# Patient Record
Sex: Male | Born: 1960 | Race: White | Hispanic: No | Marital: Married | Smoking: Former smoker
Health system: Southern US, Community
[De-identification: ages and names within clinical notes are randomized; demographics above are authoritative.]

## PROBLEM LIST (undated history)

## (undated) DIAGNOSIS — Z973 Presence of spectacles and contact lenses: Secondary | ICD-10-CM

## (undated) HISTORY — PX: FOOT SURGERY: SHX648

---
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
## 2005-09-07 ENCOUNTER — Ambulatory Visit (HOSPITAL_COMMUNITY): Admission: RE | Admit: 2005-09-07 | Discharge: 2005-09-08 | Payer: Self-pay | Admitting: Neurological Surgery

## 2006-09-10 HISTORY — PX: NECK SURGERY: SHX720

## 2007-08-08 IMAGING — RF DG CERVICAL SPINE 2 OR 3 VIEWS
1 series · 2 of 2 positions shown · non-contrast
Comparison: None.

CLINICAL DATA: ACDF C5-C7

INTRAOPERATIVE CERVICAL SPINE 1 VIEW 09/07/2005:

[Series 0: selected · 2 of 2 slices shown]
[im 1/2]
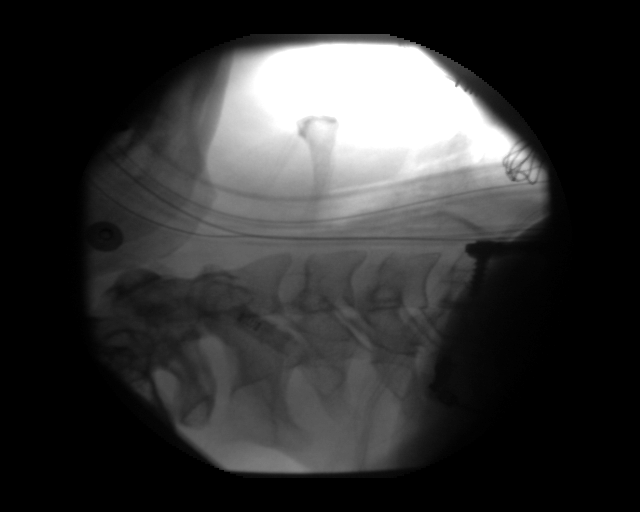
[im 2/2]
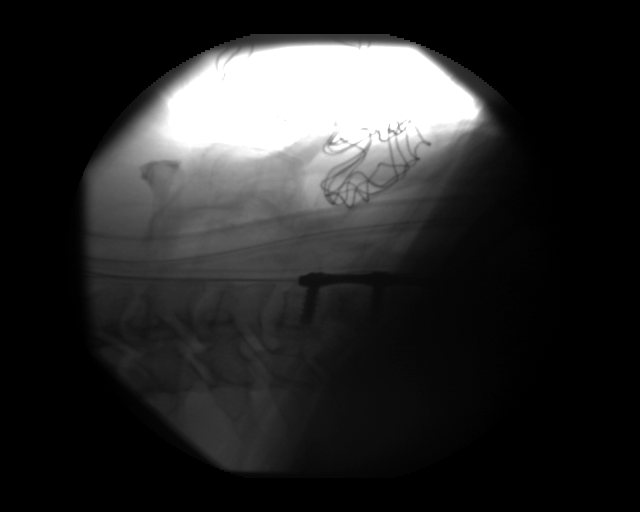

[2 of 2 positions shown; findings below may reference images not displayed]

FINDINGS: Lateral view of the cervical spine obtained with the C-arm
fluoroscopic unit is submitted for interpretation post-operatively. The patient
has undergone ACDF with hardware from C5 through C7. I am only able to visualize
through the C5-C6 disc space due to the patient's shoulders. Alignment appears
anatomic through this level.
IMPRESSION: ACDF with hardware C5 through C7.

## 2007-09-11 HISTORY — PX: SHOULDER SURGERY: SHX246

## 2017-05-01 HISTORY — PX: COLONOSCOPY: SHX174

## 2020-09-10 DIAGNOSIS — U071 COVID-19: Secondary | ICD-10-CM

## 2020-09-10 HISTORY — DX: COVID-19: U07.1

## 2021-07-17 ENCOUNTER — Encounter (HOSPITAL_BASED_OUTPATIENT_CLINIC_OR_DEPARTMENT_OTHER): Payer: Self-pay | Admitting: Podiatry

## 2021-07-17 ENCOUNTER — Ambulatory Visit: Payer: Self-pay | Admitting: Podiatry

## 2021-07-17 DIAGNOSIS — G4700 Insomnia, unspecified: Secondary | ICD-10-CM

## 2021-07-17 HISTORY — DX: Insomnia, unspecified: G47.00

## 2021-07-17 NOTE — H&P (Signed)
Complaint: Chief Complaint: heel pain; "I have right heel pain".  History of present illness: Location: Right. Duration: year(s). Character/Pain Level (Visual analog scale): 3 on a 10-point scale. Treatments: family doctor has given him shots for pain, and stretching the arch of the foot helps. Patient denies diabetes.  Current Medication: Other MD:  1. Ambien 10 Mg Tablet   ROS: Integument:  (-) for itching; (-) for psoriasis; (-) for eczema; (-) for hives; (-) for rash; (-) for wounds; (-) for skin cancer. Musculoskeletal:  (-) for arthritis; (-) for stiffness; (-) for low back pain; (-) for bursitis; (-) for gout; (-) for knee pain; (-) for hip pain. Constitutional:  (-) for fever; (-) for chills; (-) for nausea; (-) for weight gain; (-) for fatigue; (-) for weight loss.  Medical History: Patient's past medical history is unremarkable. Broke shoulder- 2009.  Vascular grafts: None.  Joint implant(s): None.  Heart valve(s): None.  Chemotherapy: No.  Serious Injuries: None.  Patient denies being enrolled in pain management program.  Surgeries: left knee 1978; neck 2008; shoulder 2009 .  Prior surgeries include LT Foot- 07/2012. Shoulder- 2009. Disc in Neck- 2006.  Family History/Social History: Arthritis: Mother. Heart Attack: Father.  Smoking History: Patient denies tobacco use. Alcohol Use: Patient denies alcohol use. INSULIN: Patient denies taking insulin. COUMADIN/PLAVIX USE: Patient denies taking coumadin/plavix.  Allergy: NKDA - No Known Drug Allergies  Examination: OBJECTIVE FINDINGS: Patient alert and oriented, pleasant and in no distress. Average BMI with good attention to grooming and well developed. VASCULAR: Palpable dorsalis pedis and posterior tibialis pulses on both feet. Normal temperature gradient from ankle to toes. Normal color and turgor. Digital capillary refill time normal. MUSCULOSKELETAL: Gait and station: Pes cavus foot type, Bilateral.  Range of motion: Adequate range of motion at ankle and subtalar joints, bilaterally. No edema in the left heel. No edema in the right heel.  Imaging Review: DIAGNOSTIC ULTRASOUND RIGHT: Performed diagnostic ultrasound using BUTTERFLY iQ handheld device 1-10 MHz using 9000-element CMUT Tranducer connected to APPLE iPAD display using Butterfly iQ APP. Obtained live action studies to correlate with clinical findings. Screen shot of imaging and any guided injection imaging is uploaded to the Butterfly iQ cloud for secure image storage. Study is annotated, reviewed, signed off and finalized on the cloud. These are available to view and/or download at any time. Thickening of the plantar fascia origin noted. Cortical bone is intact. Visualized pulling along the medial band of the plantar fascia on live action studies. Inferior calcaneal spur noted; Moderate thickening noted, 5-26mm.  Diagnosis: M72.2  [Plantar Fasciitis]   Prescription: Changed/Discontinued Medication(s):     Discontinued By Other MD: MEDROL 4 MG DOSEPAK    Discontinued By Other MD: NORCO 7.5-325 TABLET MG  PLAN: Clinical Summary Letter with office note for today's visit is available to patient through the online patient portal.  Initial office visit to take a history, form a diagnosis and a treatment plan  Diagnosis: Plantar fasciitis  EVALUATION & MANAGEMENT: Discussion: Discussed surgical options versus conservative care along with nature of procedure and post op course.  Recommendations:   Use of pneumatic cam walker to offload the foot and ankle, alleviate contracture. Plantar Fasciitis: We talked about proper stretching and supportive shoes. Discussed the cause of strain on the plantar fascia first thing in the morning and with prolonged standing. Talked about icing, massage, NSAIDS, and heel cord stretching. Discussed other modalities such as physical therapy, AFOs, orthotics, and night splints. We discussed that surgery  is  rarely necessary. This condition is aggravated by going barefoot and excessive weight. Discussed the role of cortisone injections for acute pain and swelling and then orthotics and bracing for more long term biomechanical support.  Surgical Consultation: Discussed surgical versus conservative treatment options. Risks versus benefits were discussed along with the nature of the procedure, post-operative course. Discussed possible complications, not limited to, such as: slow-healing, infection, need for further surgery, chronic pain (RSDS), blood clots, bleeding problems, weakness, chronic swelling of the foot/digits, and arthritis. Rare but serious complications can even result in loss of digit, loss of limb, or loss of life. No guarantees were given. Alternatives to surgery were discussed today.  PLANNED PROCEDURE(S). Right: Plantar fascia debridement with TOPAZ microdebrider, right heel, CPT [id number].   We will begin planning for scheduling the surgery based on patient's preference. Venipuncture: Labs drawn for Surgical profile.  SUPPLIES:   PNEUMATIC CAM WALKER: Prescribed PNEUMATIC CAM WALKER for the RIGHT LOWER EXTREMITY. Indications: (Diagnosis: Plantar fasciitis, reduce pressure at the surgical area by immobilizing flexion and extension at the ankle as well as reducing plantar pressure forces via the rocker bottom outsole) . Patient will make a decision today based on cost and/or precertification. I believe these are necessary based on patient's condition and should help in the long term management of symptoms .  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

## 2021-07-19 ENCOUNTER — Other Ambulatory Visit: Payer: Self-pay

## 2021-07-19 ENCOUNTER — Encounter (HOSPITAL_BASED_OUTPATIENT_CLINIC_OR_DEPARTMENT_OTHER): Payer: Self-pay | Admitting: Podiatry

## 2021-07-19 NOTE — Progress Notes (Addendum)
Spoke w/ via phone for pre-op interview---patient Lab needs dos----none              Lab results------none COVID test -----patient states asymptomatic no test needed Arrive at -------1145 on 07/24/2021 NPO after MN NO Solid Food.  Clear liquids from MN until---1045 Med rec completed Medications to take morning of surgery -----none Diabetic medication -----n/a Patient instructed no nail polish to be worn day of surgery Patient instructed to bring photo id and insurance card day of surgery Patient aware to have Driver (ride ) / caregiver    for 24 hours after surgery - wife [facility] Patient Special Instructions -----none Pre-Op special Istructions -----H&P & medical clearance from11/1/22 PCP Dr. Nickolas Madrid on chart. Patient verbalized understanding of instructions that were given at this phone interview. Patient denies shortness of breath, chest pain, fever, cough at this phone interview.

## 2021-07-24 ENCOUNTER — Encounter (HOSPITAL_BASED_OUTPATIENT_CLINIC_OR_DEPARTMENT_OTHER)
Admission: RE | Disposition: A | Discharge status: Home / Self Care | Payer: Self-pay | Source: Home / Self Care | Attending: Podiatry

## 2021-07-24 ENCOUNTER — Ambulatory Visit (HOSPITAL_BASED_OUTPATIENT_CLINIC_OR_DEPARTMENT_OTHER): Payer: BC Managed Care – PPO | Admitting: Anesthesiology

## 2021-07-24 ENCOUNTER — Encounter (HOSPITAL_BASED_OUTPATIENT_CLINIC_OR_DEPARTMENT_OTHER): Payer: Self-pay | Admitting: Podiatry

## 2021-07-24 ENCOUNTER — Ambulatory Visit (HOSPITAL_BASED_OUTPATIENT_CLINIC_OR_DEPARTMENT_OTHER)
Admission: RE | Admit: 2021-07-24 | Discharge: 2021-07-24 | Disposition: A | Discharge status: Home / Self Care | Payer: BC Managed Care – PPO | Attending: Podiatry | Admitting: Podiatry

## 2021-07-24 DIAGNOSIS — M722 Plantar fascial fibromatosis: Secondary | ICD-10-CM | POA: Diagnosis present

## 2021-07-24 DIAGNOSIS — Z87891 Personal history of nicotine dependence: Secondary | ICD-10-CM | POA: Diagnosis not present

## 2021-07-24 HISTORY — PX: PLANTAR FASCIA RELEASE: SHX2239

## 2021-07-24 HISTORY — DX: Presence of spectacles and contact lenses: Z97.3

## 2021-07-24 SURGERY — RELEASE, FASCIA, PLANTAR
Anesthesia: Monitor Anesthesia Care | Site: Foot | Laterality: Right

## 2021-07-24 MED ORDER — MIDAZOLAM HCL 2 MG/2ML IJ SOLN
2.0000 mg | Freq: Once | INTRAMUSCULAR | Status: AC
  Administered 2021-07-24: 2 mg via INTRAVENOUS

## 2021-07-24 MED ORDER — CEFAZOLIN SODIUM-DEXTROSE 2-4 GM/100ML-% IV SOLN
INTRAVENOUS | Status: AC
  Filled 2021-07-24: qty 100

## 2021-07-24 MED ORDER — PROPOFOL 10 MG/ML IV BOLUS
INTRAVENOUS | Status: DC | PRN
  Administered 2021-07-24 (×2): 20 mg via INTRAVENOUS

## 2021-07-24 MED ORDER — ROPIVACAINE HCL 5 MG/ML IJ SOLN
INTRAMUSCULAR | Status: DC | PRN
  Administered 2021-07-24: 35 mL via PERINEURAL

## 2021-07-24 MED ORDER — MIDAZOLAM HCL 2 MG/2ML IJ SOLN: 2.0000 mg | Freq: Once | INTRAMUSCULAR | Status: DC

## 2021-07-24 MED ORDER — SODIUM CHLORIDE 0.9 % IR SOLN
Status: DC | PRN
  Administered 2021-07-24: 500 mL

## 2021-07-24 MED ORDER — FENTANYL CITRATE (PF) 100 MCG/2ML IJ SOLN
INTRAMUSCULAR | Status: AC
  Filled 2021-07-24: qty 2

## 2021-07-24 MED ORDER — CHLORHEXIDINE GLUCONATE CLOTH 2 % EX PADS: 6.0000 | MEDICATED_PAD | Freq: Once | CUTANEOUS | Status: DC

## 2021-07-24 MED ORDER — ONDANSETRON HCL 4 MG/2ML IJ SOLN
INTRAMUSCULAR | Status: AC
  Filled 2021-07-24: qty 2

## 2021-07-24 MED ORDER — LIDOCAINE HCL (CARDIAC) PF 100 MG/5ML IV SOSY
PREFILLED_SYRINGE | INTRAVENOUS | Status: DC | PRN
  Administered 2021-07-24: 20 mg via INTRAVENOUS

## 2021-07-24 MED ORDER — FENTANYL CITRATE (PF) 100 MCG/2ML IJ SOLN
INTRAMUSCULAR | Status: DC | PRN
  Administered 2021-07-24: 25 ug via INTRAVENOUS

## 2021-07-24 MED ORDER — CLONIDINE HCL (ANALGESIA) 100 MCG/ML EP SOLN
EPIDURAL | Status: DC | PRN
  Administered 2021-07-24: 100 ug

## 2021-07-24 MED ORDER — PROPOFOL 10 MG/ML IV BOLUS
INTRAVENOUS | Status: AC
  Filled 2021-07-24: qty 20

## 2021-07-24 MED ORDER — CEFAZOLIN SODIUM-DEXTROSE 2-4 GM/100ML-% IV SOLN
2.0000 g | INTRAVENOUS | Status: AC
  Administered 2021-07-24: 2 g via INTRAVENOUS

## 2021-07-24 MED ORDER — LIDOCAINE 2% (20 MG/ML) 5 ML SYRINGE
INTRAMUSCULAR | Status: AC
  Filled 2021-07-24: qty 10

## 2021-07-24 MED ORDER — PROPOFOL 500 MG/50ML IV EMUL
INTRAVENOUS | Status: AC
  Filled 2021-07-24: qty 50

## 2021-07-24 MED ORDER — PROPOFOL 500 MG/50ML IV EMUL
INTRAVENOUS | Status: DC | PRN
  Administered 2021-07-24: 50 ug/kg/min via INTRAVENOUS

## 2021-07-24 MED ORDER — MIDAZOLAM HCL 2 MG/2ML IJ SOLN
INTRAMUSCULAR | Status: AC
  Filled 2021-07-24: qty 2

## 2021-07-24 MED ORDER — FENTANYL CITRATE (PF) 100 MCG/2ML IJ SOLN
100.0000 ug | Freq: Once | INTRAMUSCULAR | Status: AC
  Administered 2021-07-24: 100 ug via INTRAVENOUS

## 2021-07-24 MED ORDER — LACTATED RINGERS IV SOLN: INTRAVENOUS | Status: DC

## 2021-07-24 MED ORDER — LIDOCAINE 2% (20 MG/ML) 5 ML SYRINGE
INTRAMUSCULAR | Status: AC
  Filled 2021-07-24: qty 5

## 2021-07-24 SURGICAL SUPPLY — 43 items
APL PRP STRL LF DISP 70% ISPRP (MISCELLANEOUS) ×1
APL SWBSTK 6 STRL LF DISP (MISCELLANEOUS)
APPLICATOR COTTON TIP 6 STRL (MISCELLANEOUS) IMPLANT
APPLICATOR COTTON TIP 6IN STRL (MISCELLANEOUS) IMPLANT
BLADE SURG 11 STRL SS (BLADE) ×2 IMPLANT
BNDG CMPR 9X4 STRL LF SNTH (GAUZE/BANDAGES/DRESSINGS) ×1
BNDG COHESIVE 3X5 TAN STRL LF (GAUZE/BANDAGES/DRESSINGS) ×2 IMPLANT
BNDG COHESIVE 6X5 TAN NS LF (GAUZE/BANDAGES/DRESSINGS) ×1 IMPLANT
BNDG CONFORM 3 STRL LF (GAUZE/BANDAGES/DRESSINGS) ×1 IMPLANT
BNDG ESMARK 4X9 LF (GAUZE/BANDAGES/DRESSINGS) ×2 IMPLANT
CHLORAPREP W/TINT 26 (MISCELLANEOUS) ×2 IMPLANT
COVER BACK TABLE 60X90IN (DRAPES) ×2 IMPLANT
DRAPE EXTREMITY T 121X128X90 (DISPOSABLE) ×2 IMPLANT
DRAPE SHEET LG 3/4 BI-LAMINATE (DRAPES) IMPLANT
ELECT REM PT RETURN 9FT ADLT (ELECTROSURGICAL)
ELECTRODE REM PT RTRN 9FT ADLT (ELECTROSURGICAL) IMPLANT
GAUZE 4X4 16PLY ~~LOC~~+RFID DBL (SPONGE) ×2 IMPLANT
GAUZE SPONGE 4X4 12PLY STRL (GAUZE/BANDAGES/DRESSINGS) ×1 IMPLANT
GAUZE XEROFORM 1X8 LF (GAUZE/BANDAGES/DRESSINGS) ×2 IMPLANT
GLOVE SURG POLYISO LF SZ8 (GLOVE) ×2 IMPLANT
GOWN STRL REUS W/TWL LRG LVL3 (GOWN DISPOSABLE) ×4 IMPLANT
K-WIRE SURGICAL 1.6X102 (WIRE) ×1 IMPLANT
KIT TURNOVER CYSTO (KITS) ×2 IMPLANT
MANIFOLD NEPTUNE II (INSTRUMENTS) IMPLANT
NDL HYPO 25X1 1.5 SAFETY (NEEDLE) ×1 IMPLANT
NEEDLE HYPO 25X1 1.5 SAFETY (NEEDLE) ×2 IMPLANT
NS IRRIG 500ML POUR BTL (IV SOLUTION) ×2 IMPLANT
PACK BASIN DAY SURGERY FS (CUSTOM PROCEDURE TRAY) ×2 IMPLANT
PAD CAST 3X4 CTTN HI CHSV (CAST SUPPLIES) IMPLANT
PADDING CAST ABS 4INX4YD NS (CAST SUPPLIES) ×1
PADDING CAST ABS COTTON 4X4 ST (CAST SUPPLIES) ×1 IMPLANT
PADDING CAST COTTON 3X4 STRL (CAST SUPPLIES) ×2
PENCIL SMOKE EVACUATOR (MISCELLANEOUS) IMPLANT
STOCKINETTE 4X48 STRL (DRAPES) ×2 IMPLANT
SUT ETHILON 4 0 PS 2 18 (SUTURE) ×2 IMPLANT
SUT MNCRL AB 4-0 PS2 18 (SUTURE) IMPLANT
SUT VIC AB 3-0 SH 27 (SUTURE)
SUT VIC AB 3-0 SH 27X BRD (SUTURE) IMPLANT
SYR 10ML LL (SYRINGE) ×2 IMPLANT
SYR CONTROL 10ML LL (SYRINGE) IMPLANT
TOPAZ MICROBLATION (SURGICAL WAND) ×1 IMPLANT
TUBE CONNECTING 12X1/4 (SUCTIONS) IMPLANT
UNDERPAD 30X36 HEAVY ABSORB (UNDERPADS AND DIAPERS) ×2 IMPLANT

## 2021-07-24 NOTE — H&P (Signed)
Reviewed chart and spoke with patient about right heel surgery, we are going to proceed today

## 2021-07-24 NOTE — Op Note (Signed)
Operative Report  Preop Diagnosis: Plantar fasciitis right heel  Postop Diagnosis: Same  Procedure: Debridement of plantar fascial origin using Topaz device right heel  Anesthesia: IV sedation with popliteal block right lower extremity  Hemostasis: Tourniquet at 250 mmHg  Blood Loss: 2 mL  Surgical Indications: Chronic plantar fasciitis pain not responding to conservative care discussed surgical options consent signed in the chart outlining risk first benefits  Procedures Performed: He is brought back placed in a supine position right foot is prepped draped in usual sterile manner tourniquet insufflated to 250 mmHg.  Stab incisions were made using a 6 2 K wire at the plantar heel at the plantar fascial origin 10 stab incisions were made.  We then used the Topaz device to perform Coblation at a setting of 4, inserted into each of the stab incisions and cut at varying depths and angles about 5 or 6 per incision site and patient tolerated the procedure well.  Irrigated during the procedure.  Xeroform gauze wet-to-dry dressing applied tourniquet was deflated patient is placed in a compression dressing with Coban no complications we will see him back in the office in a week

## 2021-07-24 NOTE — Transfer of Care (Signed)
Immediate Anesthesia Transfer of Care Note  Patient: Christopher Mack  Procedure(s) Performed: PLANTAR FASCIA DEBRIDEMENT WITH TOPAZ, RIGHT FOOT (Right: Foot)  Patient Location: PACU  Anesthesia Type:MAC and Regional  Level of Consciousness: awake, alert , oriented and patient cooperative  Airway & Oxygen Therapy: Patient Spontanous Breathing  Post-op Assessment: Report given to RN and Post -op Vital signs reviewed and stable  Post vital signs: Reviewed and stable  Last Vitals:  Vitals Value Taken Time  BP    Temp    Pulse 69 07/24/21 1441  Resp 20 07/24/21 1441  SpO2 92 % 07/24/21 1441  Vitals shown include unvalidated device data.  Last Pain:  Vitals:   07/24/21 1159  TempSrc: Oral  PainSc: 0-No pain      Patients Stated Pain Goal: 5 (36/72/55 0016)  Complications: No notable events documented.

## 2021-07-24 NOTE — Anesthesia Preprocedure Evaluation (Signed)
Anesthesia Evaluation  Patient identified by MRN, date of birth, ID band Patient awake    Reviewed: Allergy & Precautions, NPO status , Patient's Chart, lab work & pertinent test results  Airway Mallampati: II  TM Distance: >3 FB     Dental no notable dental hx.    Pulmonary neg pulmonary ROS, former smoker,    Pulmonary exam normal        Cardiovascular negative cardio ROS   Rhythm:Regular Rate:Normal     Neuro/Psych negative neurological ROS     GI/Hepatic negative GI ROS, Neg liver ROS,   Endo/Other  negative endocrine ROS  Renal/GU negative Renal ROS  negative genitourinary   Musculoskeletal Right plantar fascitis    Abdominal Normal abdominal exam  (+)   Peds  Hematology negative hematology ROS (+)   Anesthesia Other Findings   Reproductive/Obstetrics                             Anesthesia Physical Anesthesia Plan  ASA: 2  Anesthesia Plan: MAC and Regional   Post-op Pain Management:  Regional for Post-op pain   Induction: Intravenous  PONV Risk Score and Plan: 1 and Ondansetron, Dexamethasone, Propofol infusion, TIVA, Midazolam and Treatment may vary due to age or medical condition  Airway Management Planned: Simple Face Mask, Natural Airway and Nasal Cannula  Additional Equipment: None  Intra-op Plan:   Post-operative Plan:   Informed Consent: I have reviewed the patients History and Physical, chart, labs and discussed the procedure including the risks, benefits and alternatives for the proposed anesthesia with the patient or authorized representative who has indicated his/her understanding and acceptance.     Dental advisory given  Plan Discussed with: CRNA  Anesthesia Plan Comments:         Anesthesia Quick Evaluation

## 2021-07-24 NOTE — Anesthesia Postprocedure Evaluation (Signed)
Anesthesia Post Note  Patient: Christopher Mack  Procedure(s) Performed: PLANTAR FASCIA DEBRIDEMENT WITH TOPAZ, RIGHT FOOT (Right: Foot)     Patient location during evaluation: PACU Anesthesia Type: Regional and MAC Level of consciousness: awake and alert Pain management: pain level controlled Vital Signs Assessment: post-procedure vital signs reviewed and stable Respiratory status: spontaneous breathing, nonlabored ventilation, respiratory function stable and patient connected to nasal cannula oxygen Cardiovascular status: stable and blood pressure returned to baseline Postop Assessment: no apparent nausea or vomiting Anesthetic complications: no   No notable events documented.  Last Vitals:  Vitals:   07/24/21 1500 07/24/21 1515  BP: 103/75 112/80  Pulse: (!) 59 (!) 53  Resp: 10 13  Temp:  (!) 36.4 C  SpO2: 96% 99%    Last Pain:  Vitals:   07/24/21 1159  TempSrc: Oral  PainSc: 0-No pain                 Belenda Cruise P Ward Boissonneault

## 2021-07-24 NOTE — Progress Notes (Signed)
Assisted Dr. Greg Stoltzfus with right, ultrasound guided, popliteal, adductor canal block. Side rails up, monitors on throughout procedure. See vital signs in flow sheet. Tolerated Procedure well. 

## 2021-07-24 NOTE — Discharge Instructions (Addendum)
Post op instructions Dr. Sherlynn Stalls, DPM [facility]  1. Take your medication as prescribed.  Pain medication should be taken only as needed.  2.  Keep the dressing clean, dry and intact.  3.  Keep your foot elevated above the heart level for the first 48 hours.  4.  Walking to the bathroom and brief periods of walking are acceptable unless    we have instructed you to be non-weight bearing.  5.  Always wear your Cam walker, walking boot when walking.  Always use your crutches if    you are to be non-weight bearing.  6.  Do not take a shower.  Baths are permissible as long as the foot is kept out of water.  7.  Every hour you are awake:   Bend your knee 15 times  Flex foot 15 times  Massage 15 times  8.  Call [facility], [phone] if any of the following problems occur    You develop a temperature or fever  The bandage becomes saturated with blood  Medication does not stop your pain  Injury of the foot occurs  Any symptoms of infection including redness, odor, or red streaks  Norco for pain management was called into your pharmacy.   Post Anesthesia Home Care Instructions  Activity: Get plenty of rest for the remainder of the day. A responsible individual must stay with you for 24 hours following the procedure.  For the next 24 hours, DO NOT: -Drive a car -Advertising copywriter -Drink alcoholic beverages -Take any medication unless instructed by your physician -Make any legal decisions or sign important papers.  Meals: Start with liquid foods such as gelatin or soup. Progress to regular foods as tolerated. Avoid greasy, spicy, heavy foods. If nausea and/or vomiting occur, drink only clear liquids until the nausea and/or vomiting subsides. Call your physician if vomiting continues.  Special Instructions/Symptoms: Your throat may feel dry or sore from the anesthesia or the breathing tube placed in your throat during surgery. If this causes  discomfort, gargle with warm salt water. The discomfort should disappear within 24 hours.

## 2021-07-24 NOTE — Anesthesia Procedure Notes (Addendum)
Anesthesia Regional Block: Popliteal block   Pre-Anesthetic Checklist: , timeout performed,  Correct Patient, Correct Site, Correct Laterality,  Correct Procedure, Correct Position, site marked,  Risks and benefits discussed,  Surgical consent,  Pre-op evaluation,  At surgeon's request and post-op pain management  Laterality: Right  Prep: Dura Prep       Needles:  Injection technique: Single-shot  Needle Type: Echogenic Stimulator Needle     Needle Length: 10cm  Needle Gauge: 20     Additional Needles:   Procedures:,,,, ultrasound used (permanent image in chart),,    Narrative:  Start time: 07/24/2021 1:21 PM End time: 07/24/2021 1:24 PM Injection made incrementally with aspirations every 5 mL.  Performed by: Personally  Anesthesiologist: Atilano Median, DO  Additional Notes: Patient identified. Risks/Benefits/Options discussed with patient including but not limited to bleeding, infection, nerve damage, failed block, incomplete pain control. Patient expressed understanding and wished to proceed. All questions were answered. Sterile technique was used throughout the entire procedure. Please see nursing notes for vital signs. Aspirated in 5cc intervals with injection for negative confirmation. Patient was given instructions on fall risk and not to get out of bed. All questions and concerns addressed with instructions to call with any issues or inadequate analgesia.

## 2021-07-24 NOTE — Anesthesia Procedure Notes (Addendum)
Anesthesia Regional Block: Adductor canal block   Pre-Anesthetic Checklist: , timeout performed,  Correct Patient, Correct Site, Correct Laterality,  Correct Procedure, Correct Position, site marked,  Risks and benefits discussed,  Surgical consent,  Pre-op evaluation,  At surgeon's request and post-op pain management  Laterality: Right  Prep: Dura Prep       Needles:  Injection technique: Single-shot  Needle Type: Echogenic Stimulator Needle     Needle Length: 10cm  Needle Gauge: 20     Additional Needles:   Procedures:,,,, ultrasound used (permanent image in chart),,    Narrative:  Start time: 07/24/2021 1:18 PM End time: 07/24/2021 1:20 PM Injection made incrementally with aspirations every 5 mL.  Performed by: Personally  Anesthesiologist: Atilano Median, DO  Additional Notes: Patient identified. Risks/Benefits/Options discussed with patient including but not limited to bleeding, infection, nerve damage, failed block, incomplete pain control. Patient expressed understanding and wished to proceed. All questions were answered. Sterile technique was used throughout the entire procedure. Please see nursing notes for vital signs. Aspirated in 5cc intervals with injection for negative confirmation. Patient was given instructions on fall risk and not to get out of bed. All questions and concerns addressed with instructions to call with any issues or inadequate analgesia.

## 2021-07-25 ENCOUNTER — Encounter (HOSPITAL_BASED_OUTPATIENT_CLINIC_OR_DEPARTMENT_OTHER): Payer: Self-pay | Admitting: Podiatry

## 2021-09-06 ENCOUNTER — Encounter (HOSPITAL_BASED_OUTPATIENT_CLINIC_OR_DEPARTMENT_OTHER): Payer: Self-pay | Admitting: Podiatry
